# Patient Record
Sex: Male | Born: 1940 | Race: White | Hispanic: No | Marital: Married | Smoking: Never smoker
Health system: Southern US, Community
[De-identification: ages and names within clinical notes are randomized; demographics above are authoritative.]

## PROBLEM LIST (undated history)

## (undated) DIAGNOSIS — K219 Gastro-esophageal reflux disease without esophagitis: Secondary | ICD-10-CM

## (undated) DIAGNOSIS — C801 Malignant (primary) neoplasm, unspecified: Secondary | ICD-10-CM

---

## 2015-05-14 HISTORY — PX: OTHER SURGICAL HISTORY: SHX169

## 2015-09-01 ENCOUNTER — Other Ambulatory Visit: Payer: Self-pay | Admitting: Hematology & Oncology

## 2015-09-01 DIAGNOSIS — C189 Malignant neoplasm of colon, unspecified: Secondary | ICD-10-CM

## 2015-09-01 DIAGNOSIS — C787 Secondary malignant neoplasm of liver and intrahepatic bile duct: Principal | ICD-10-CM

## 2015-09-05 ENCOUNTER — Other Ambulatory Visit: Payer: Self-pay | Admitting: Hematology & Oncology

## 2015-09-05 ENCOUNTER — Inpatient Hospital Stay
Admission: RE | Admit: 2015-09-05 | Discharge: 2015-09-05 | Disposition: A | Discharge status: Home / Self Care | Payer: Self-pay | Source: Ambulatory Visit | Attending: Hematology & Oncology | Admitting: Hematology & Oncology

## 2015-09-05 DIAGNOSIS — C801 Malignant (primary) neoplasm, unspecified: Secondary | ICD-10-CM

## 2015-09-08 ENCOUNTER — Other Ambulatory Visit: Payer: Self-pay | Admitting: Radiology

## 2015-09-09 ENCOUNTER — Ambulatory Visit (HOSPITAL_COMMUNITY)
Admission: RE | Admit: 2015-09-09 | Discharge: 2015-09-09 | Disposition: A | Discharge status: Home / Self Care | Payer: Medicare PPO | Source: Ambulatory Visit | Attending: Hematology & Oncology | Admitting: Hematology & Oncology

## 2015-09-09 ENCOUNTER — Other Ambulatory Visit: Payer: Self-pay | Admitting: Hematology & Oncology

## 2015-09-09 ENCOUNTER — Inpatient Hospital Stay
Admission: RE | Admit: 2015-09-09 | Discharge: 2015-09-09 | Disposition: A | Discharge status: Home / Self Care | Payer: Self-pay | Source: Ambulatory Visit | Attending: Hematology & Oncology | Admitting: Hematology & Oncology

## 2015-09-09 ENCOUNTER — Encounter (HOSPITAL_COMMUNITY): Payer: Self-pay

## 2015-09-09 DIAGNOSIS — C801 Malignant (primary) neoplasm, unspecified: Secondary | ICD-10-CM

## 2015-09-09 DIAGNOSIS — C189 Malignant neoplasm of colon, unspecified: Secondary | ICD-10-CM

## 2015-09-09 DIAGNOSIS — C787 Secondary malignant neoplasm of liver and intrahepatic bile duct: Secondary | ICD-10-CM | POA: Insufficient documentation

## 2015-09-09 HISTORY — DX: Gastro-esophageal reflux disease without esophagitis: K21.9

## 2015-09-09 HISTORY — DX: Malignant (primary) neoplasm, unspecified: C80.1

## 2015-09-09 LAB — APTT: APTT: 30 s (ref 24–37)

## 2015-09-09 LAB — COMPREHENSIVE METABOLIC PANEL
ALBUMIN: 4.1 g/dL (ref 3.5–5.0)
ALK PHOS: 59 U/L (ref 38–126)
ALT: 12 U/L — AB (ref 17–63)
AST: 18 U/L (ref 15–41)
Anion gap: 8 (ref 5–15)
BILIRUBIN TOTAL: 0.3 mg/dL (ref 0.3–1.2)
BUN: 10 mg/dL (ref 6–20)
CO2: 25 mmol/L (ref 22–32)
CREATININE: 1.12 mg/dL (ref 0.61–1.24)
Calcium: 9.1 mg/dL (ref 8.9–10.3)
Chloride: 104 mmol/L (ref 101–111)
GFR calc Af Amer: >60 mL/min (ref >60)
GFR calc non Af Amer: >60 mL/min (ref >60)
GLUCOSE: 102 mg/dL — AB (ref 65–99)
POTASSIUM: 4.8 mmol/L (ref 3.5–5.1)
Sodium: 137 mmol/L (ref 135–145)
TOTAL PROTEIN: 7.1 g/dL (ref 6.5–8.1)

## 2015-09-09 LAB — CBC WITH DIFFERENTIAL/PLATELET
BASOS ABS: 0 10*3/uL (ref 0.0–0.1)
BASOS PCT: 0 %
Eosinophils Absolute: 0.7 10*3/uL (ref 0.0–0.7)
Eosinophils Relative: 13 %
HEMATOCRIT: 40.7 % (ref 39.0–52.0)
HEMOGLOBIN: 13.3 g/dL (ref 13.0–17.0)
LYMPHS PCT: 32 %
Lymphs Abs: 1.6 10*3/uL (ref 0.7–4.0)
MCH: 25.7 pg — ABNORMAL LOW (ref 26.0–34.0)
MCHC: 32.7 g/dL (ref 30.0–36.0)
MCV: 78.7 fL (ref 78.0–100.0)
Monocytes Absolute: 0.5 10*3/uL (ref 0.1–1.0)
Monocytes Relative: 9 %
NEUTROS ABS: 2.3 10*3/uL (ref 1.7–7.7)
NEUTROS PCT: 46 %
Platelets: 174 10*3/uL (ref 150–400)
RBC: 5.17 MIL/uL (ref 4.22–5.81)
RDW: 18.4 % — AB (ref 11.5–15.5)
WBC: 5 10*3/uL (ref 4.0–10.5)

## 2015-09-09 LAB — PROTIME-INR
INR: 1.1 (ref 0.00–1.49)
PROTHROMBIN TIME: 13.9 s (ref 11.6–15.2)

## 2015-09-09 MED ORDER — MIDAZOLAM HCL 2 MG/2ML IJ SOLN
INTRAMUSCULAR | Status: AC
  Filled 2015-09-09: qty 6

## 2015-09-09 MED ORDER — FENTANYL CITRATE (PF) 100 MCG/2ML IJ SOLN
INTRAMUSCULAR | Status: AC | PRN
  Administered 2015-09-09: 50 ug via INTRAVENOUS

## 2015-09-09 MED ORDER — FENTANYL CITRATE (PF) 100 MCG/2ML IJ SOLN
INTRAMUSCULAR | Status: AC
  Filled 2015-09-09: qty 4

## 2015-09-09 MED ORDER — MIDAZOLAM HCL 2 MG/2ML IJ SOLN
INTRAMUSCULAR | Status: AC | PRN
  Administered 2015-09-09: 0.5 mg via INTRAVENOUS
  Administered 2015-09-09: 1 mg via INTRAVENOUS

## 2015-09-09 MED ORDER — SODIUM CHLORIDE 0.9 % IV SOLN
INTRAVENOUS | Status: DC
  Administered 2015-09-09: 11:00:00 via INTRAVENOUS

## 2015-09-09 NOTE — Discharge Instructions (Signed)
Liver Biopsy °The liver is a large organ in the upper right-hand side of your abdomen. A liver biopsy is a procedure in which a tissue sample is taken from the liver and examined under a microscope. The procedure is done to confirm a suspected problem. °There are three types of liver biopsies: °· Percutaneous. In this type, an incision is made in your abdomen. The sample is removed through the incision with a needle. °· Laparoscopic. In this type, several incisions are made in the abdomen. A tiny camera is passed through one of the incisions to help guide the health care provider. The sample is removed through the other incision or incisions. °· Transjugular. In this type, an incision is made in the neck. A tube is passed through the incision to the liver. The sample is removed through the tube with a needle. °LET YOUR HEALTH CARE PROVIDER KNOW ABOUT: °· Any allergies you have. °· All medicines you are taking, including vitamins, herbs, eye drops, creams, and over-the-counter medicines. °· Previous problems you or members of your family have had with the use of anesthetics. °· Any blood disorders you have. °· Previous surgeries you have had. °· Medical conditions you have. °· Possibility of pregnancy, if this applies. °RISKS AND COMPLICATIONS °Generally, this is a safe procedure. However, problems can occur and include: °· Bleeding. °· Infection. °· Bruising. °· Collapsed lung. °· Leak of digestive juices (bile) from the liver or gallbladder. °· Problems with heart rhythm. °· Pain at the biopsy site or in the right shoulder. °· Low blood pressure (hypotension). °· Injury to nearby organs or tissues. °BEFORE THE PROCEDURE °· Your health care provider may do some blood or urine tests. These will help your health care provider learn how well your kidneys and liver are working and how well your blood clots. °· Ask your health care provider if you will be able to go home the day of the procedure. Arrange for someone to  take you home and stay with you for at least 24 hours. °· Do not eat or drink anything after midnight on the night before the procedure or as directed by your health care provider. °· Ask your health care provider about: °· Changing or stopping your regular medicines. This is especially important if you are taking diabetes medicines or blood thinners. °· Taking medicines such as aspirin and ibuprofen. These medicines can thin your blood. Do not take these medicines before your procedure if your health care provider asks you not to. °PROCEDURE °Regardless of the type of biopsy that will be done, you will have an IV line placed. Through this line, you will receive fluids and medicine to relax you. If you will be having a laparoscopic biopsy, you may also receive medicine through this line to make you sleep during the procedure (general anesthetic). °Percutaneous Liver Biopsy °· You will positioned on your back, with your right hand over your head. °· A health care provider will locate your liver by tapping and pressing on the right side of your abdomen or with the help of an ultrasound machine or CT scan. °· An area at the bottom of your last right rib will be numbed. °· An incision will be made in the numbed area. °· The biopsy needle will be inserted into the incision. °· Several samples of liver tissue will be taken with the biopsy needle. You will be asked to hold your breath as each sample is taken. °Laparoscopic Liver Biopsy °· You will be   positioned on your back. °· Several small incisions will be made in your abdomen. °· Your doctor will pass a tiny camera through one incision. The camera will allow the liver to be viewed on a TV monitor in the operating room. °· Tools will be passed through the other incision or incisions. These tools will be used to remove samples of liver tissue. °Transjugular Liver Biopsy °· You will be positioned on your back on an X-ray table, with your head turned to your left. °· An  area on your neck just over your jugular vein will be numbed. °· An incision will be made in the numbed area. °· A tiny tube will be inserted through the incision. It will be pushed through the jugular vein to a blood vessel in the liver called the hepatic vein. °· Dye will be inserted through the tube, and X-rays will be taken. The dye will make the blood vessels in the liver light up on the X-rays. °· The biopsy needle will be pushed through the tube until it reaches the liver. °· Samples of liver tissue will be taken with the biopsy needle. °· The needle and the tube will be removed. °After the samples are obtained, the incision or incisions will be closed. °AFTER THE PROCEDURE °· You will be taken to a recovery area. °· You may have to lie on your right side for 1-2 hours. This will prevent bleeding from the biopsy site. °· Your progress will be watched. Your blood pressure, pulse, and the biopsy site will be checked often. °· You may have some pain or feel sick. If this happens, tell your health care provider. °· As you begin to feel better, you will be offered ice and beverages. °· You may be allowed to go home when the medicines have worn off and you can walk, drink, eat, and use the bathroom. °  °This information is not intended to replace advice given to you by your health care provider. Make sure you discuss any questions you have with your health care provider. °  °Document Released: 05/22/2003 Document Revised: 03/22/2014 Document Reviewed: 04/27/2013 °Elsevier Interactive Patient Education ©2016 [employer] °Liver Biopsy, Care After °Refer to this sheet in the next few weeks. These instructions provide you with information on caring for yourself after your procedure. Your health care provider may also give you more specific instructions. Your treatment has been planned according to current medical practices, but problems sometimes occur. Call your health care provider if you have any problems or  questions after your procedure. °WHAT TO EXPECT AFTER THE PROCEDURE °After your procedure, it is typical to have the following: °· A small amount of discomfort in the area where the biopsy was done and in the right shoulder or shoulder blade. °· A small amount of bruising around the area where the biopsy was done and on the skin over the liver. °· Sleepiness and fatigue for the rest of the day. °HOME CARE INSTRUCTIONS  °· Rest at home for 1-2 days or as directed by your health care provider. °· Have a friend or family member stay with you for at least 24 hours. °· Because of the medicines used during the procedure, you should not do the following things in the first 24 hours: °¨ Drive. °¨ Use machinery. °¨ Be responsible for the care of other people. °¨ Sign legal documents. °¨ Take a bath or shower. °· There are many different ways to close and cover an incision, including stitches,   skin glue, and adhesive strips. Follow your health care provider's instructions on: °¨ Incision care. °¨ Bandage (dressing) changes and removal. °¨ Incision closure removal. °· Do not drink alcohol in the first week. °· Do not lift more than 5 pounds or play contact sports for 2 weeks after this test. °· Take medicines only as directed by your health care provider. Do not take medicine containing aspirin or non-steroidal anti-inflammatory medicines such as ibuprofen for 1 week after this test. °· It is your responsibility to get your test results. °SEEK MEDICAL CARE IF:  °· You have increased bleeding from an incision that results in more than a small spot of blood. °· You have redness, swelling, or increasing pain in any incisions. °· You notice a discharge or a bad smell coming from any of your incisions. °· You have a fever or chills. °SEEK IMMEDIATE MEDICAL CARE IF:  °· You develop swelling, bloating, or pain in your abdomen. °· You become dizzy or faint. °· You develop a rash. °· You are nauseous or vomit. °· You have difficulty  breathing, feel short of breath, or feel faint. °· You develop chest pain. °· You have problems with your speech or vision. °· You have trouble balancing or moving your arms or legs. °  °This information is not intended to replace advice given to you by your health care provider. Make sure you discuss any questions you have with your health care provider. °  °Document Released: 09/18/2004 Document Revised: 03/22/2014 Document Reviewed: 04/27/2013 °Elsevier Interactive Patient Education ©2016 [employer] °Moderate Conscious Sedation, Adult, Care After °Refer to this sheet in the next few weeks. These instructions provide you with information on caring for yourself after your procedure. Your health care provider may also give you more specific instructions. Your treatment has been planned according to current medical practices, but problems sometimes occur. Call your health care provider if you have any problems or questions after your procedure. °WHAT TO EXPECT AFTER THE PROCEDURE  °After your procedure: °· You may feel sleepy, clumsy, and have poor balance for several hours. °· Vomiting may occur if you eat too soon after the procedure. °HOME CARE INSTRUCTIONS °· Do not participate in any activities where you could become injured for at least 24 hours. Do not: °¨ Drive. °¨ Swim. °¨ Ride a bicycle. °¨ Operate heavy machinery. °¨ Cook. °¨ Use power tools. °¨ Climb ladders. °¨ Work from a high place. °· Do not make important decisions or sign legal documents until you are improved. °· If you vomit, drink water, juice, or soup when you can drink without vomiting. Make sure you have little or no nausea before eating solid foods. °· Only take over-the-counter or prescription medicines for pain, discomfort, or fever as directed by your health care provider. °· Make sure you and your family fully understand everything about the medicines given to you, including what side effects may occur. °· You should not drink alcohol,  take sleeping pills, or take medicines that cause drowsiness for at least 24 hours. °· If you smoke, do not smoke without supervision. °· If you are feeling better, you may resume normal activities 24 hours after you were sedated. °· Keep all appointments with your health care provider. °SEEK MEDICAL CARE IF: °· Your skin is pale or bluish in color. °· You continue to feel nauseous or vomit. °· Your pain is getting worse and is not helped by medicine. °· You have bleeding or swelling. °· You are   still sleepy or feeling clumsy after 24 hours. °SEEK IMMEDIATE MEDICAL CARE IF: °· You develop a rash. °· You have difficulty breathing. °· You develop any type of allergic problem. °· You have a fever. °MAKE SURE YOU: °· Understand these instructions. °· Will watch your condition. °· Will get help right away if you are not doing well or get worse. °  °This information is not intended to replace advice given to you by your health care provider. Make sure you discuss any questions you have with your health care provider. °  °Document Released: 12/20/2012 Document Revised: 03/22/2014 Document Reviewed: 12/20/2012 °Elsevier Interactive Patient Education ©2016 [employer] ° °

## 2015-09-09 NOTE — Procedures (Signed)
Interventional Radiology Procedure Note  Procedure: US guided core biopsy liver lesion.  Lesion very difficult to visualize, partially obscured by lung.  Region of lesion bx'd.   Complications: None immediate  Estimated Blood Loss: None  Recommendations: - Bedrest x 3 hrs - Path pending  Signed,  Criselda Peaches, MD

## 2015-09-09 NOTE — Progress Notes (Signed)
Pt is noted to have a colostomy bag in place.

## 2015-09-09 NOTE — Consult Note (Signed)
Chief Complaint: Patient was seen in consultation today for image guided liver lesion biopsy  Referring Physician(s): Ennever,Peter R  Supervising Physician: Jacqulynn Cadet  Patient Status: Outpatient  History of Present Illness: Joseph Bolton is a 75 y.o. male with history of recently diagnosed colon cancer in March 2017 and prior colectomy in [place]. Recent imaging studies have revealed possible liver metastasis. He presents today for image guided liver lesion biopsy for further evaluation.  Past Medical History  Diagnosis Date  . GERD (gastroesophageal reflux disease)   . Cancer [facility])     colon cancer-March 2017    Past Surgical History  Procedure Laterality Date  . Removal of large intestines  March 2017    Allergies: Review of patient's allergies indicates not on file.  Medications: Prior to Admission medications   Medication Sig Start Date End Date Taking? Authorizing Provider  famotidine (PEPCID) 20 MG tablet Take 20 mg by mouth 2 (two) times daily.   Yes Historical Provider, MD  ketoconazole (NIZORAL) 2 % cream Apply 1 application topically daily as needed for irritation. Applies to buttocks   Yes Historical Provider, MD     History reviewed. No pertinent family history.  Social History   Social History  . Marital Status: Married    Spouse Name: N/A  . Number of Children: N/A  . Years of Education: N/A   Social History Main Topics  . Smoking status: Never Smoker   . Smokeless tobacco: Current User    Types: Chew  . Alcohol Use: 4.2 oz/week    7 Shots of liquor per week     Comment: 1 shot of liquor a day-hasn't had since March 2017  . Drug Use: No  . Sexual Activity: Not Asked   Other Topics Concern  . None   Social History Narrative  . None     Review of Systems  Constitutional: Negative for fever and chills.  Respiratory: Negative for cough and shortness of breath.   Cardiovascular: Negative for chest pain.    Gastrointestinal: Negative for nausea, vomiting, abdominal pain and blood in stool.  Genitourinary: Negative for dysuria and hematuria.  Musculoskeletal: Negative for back pain.  Neurological: Negative for headaches.    Vital Signs: BP 156/64 mmHg  Pulse 66  Temp(Src) 97.9 F (36.6 C) (Oral)  Resp 18  Ht 5\' 6"  (1.676 m)  Wt 174 lb 3.2 oz (79.017 kg)  BMI 28.13 kg/m2  SpO2 100%  Physical Exam  Constitutional: He is oriented to person, place, and time. He appears well-developed and well-nourished.  Cardiovascular: Normal rate and regular rhythm.   Pulmonary/Chest: Effort normal and breath sounds normal.  Abdominal: Soft. Bowel sounds are normal.  Intact colostomy  Musculoskeletal: Normal range of motion. He exhibits no edema.  Neurological: He is alert and oriented to person, place, and time.    Mallampati Score:     Imaging: No results found.  Labs:  CBC:  Recent Labs  09/09/15 1105  WBC 5.0  HGB 13.3  HCT 40.7  PLT 174    COAGS:  Recent Labs  09/09/15 1105  INR 1.10  APTT 30    BMP:  Recent Labs  09/09/15 1105  NA 137  K 4.8  CL 104  CO2 25  GLUCOSE 102*  BUN 10  CALCIUM 9.1  CREATININE 1.12  GFRNONAA >60  GFRAA >60    LIVER FUNCTION TESTS:  Recent Labs  09/09/15 1105  BILITOT 0.3  AST 18  ALT 12*  ALKPHOS 59  PROT 7.1  ALBUMIN 4.1    TUMOR MARKERS: No results for input(s): AFPTM, CEA, CA199, CHROMGRNA in the last 8760 hours.  Assessment and Plan: Patient with recently diagnosed colon cancer and possible liver metastasis (previous workup performed in [place]). Plan today is for image guided liver lesion biopsy for further evaluation.Risks and benefits discussed with the patient including, but not limited to bleeding, infection, damage to adjacent structures or low yield requiring additional tests.All of the patient's questions were answered, patient is agreeable to proceed.Consent signed and in chart.     Thank you for  this interesting consult.  I greatly enjoyed meeting CANON WYKLE and look forward to participating in their care.  A copy of this report was sent to the requesting provider on this date.  Electronically Signed: D. Rowe Robert 09/09/2015, 1:14 PM   I spent a total of 20 minutes    in face to face in clinical consultation, greater than 50% of which was counseling/coordinating care for image guided liver lesion biopsy

## 2015-09-12 ENCOUNTER — Encounter: Payer: Self-pay | Admitting: Nurse Practitioner

## 2015-09-12 NOTE — Progress Notes (Signed)
Pathology information sent to Joseph Bolton, [place] at [phone] as requested. Confirmation received.

## 2015-09-23 ENCOUNTER — Encounter (HOSPITAL_COMMUNITY): Payer: Self-pay

## 2017-11-03 IMAGING — CT CT OUTSIDE FILMS BODY
3 series · 17 of 46 positions shown, 20 images · non-contrast
Comparison: none

[Series 3: non con 5.0 b30f · axial · non-contrast · 0.80mm/px · z∈[-214,+76]mm · 13 of 66 slices shown, 16 images]
[im 5/66  soft-tissue]
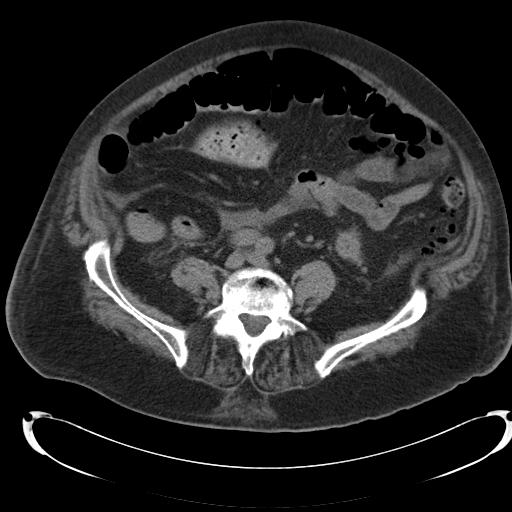
[im 5/66  bone]
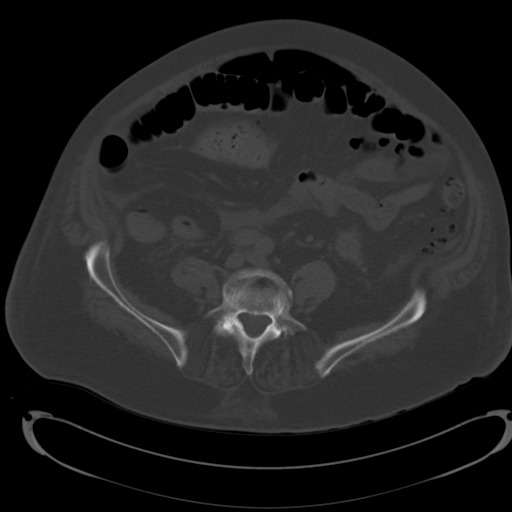
[im 11/66  soft-tissue]
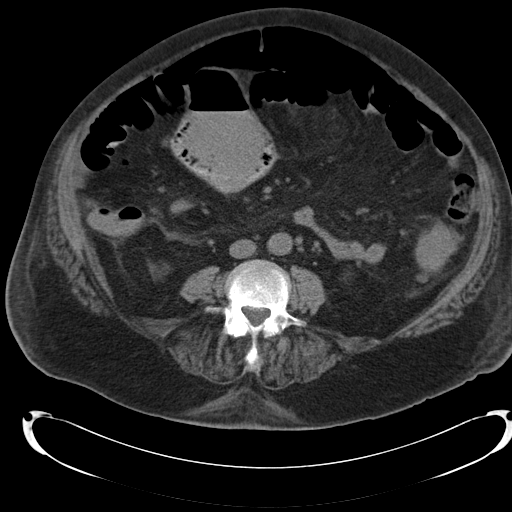
[im 17/66  soft-tissue]
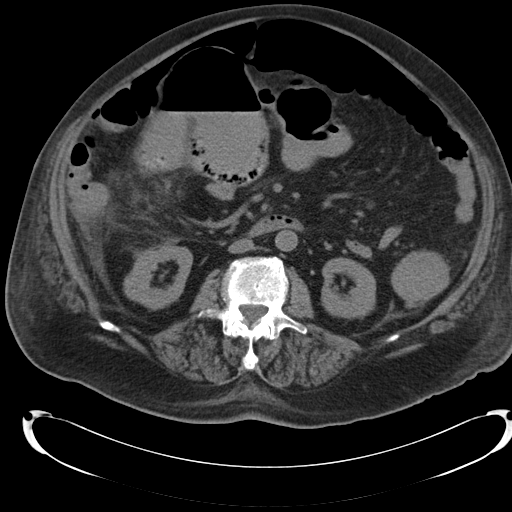
[im 24/66  soft-tissue]
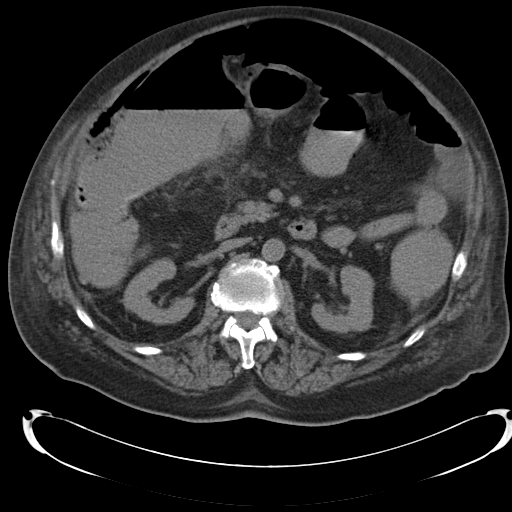
[im 30/66  soft-tissue]
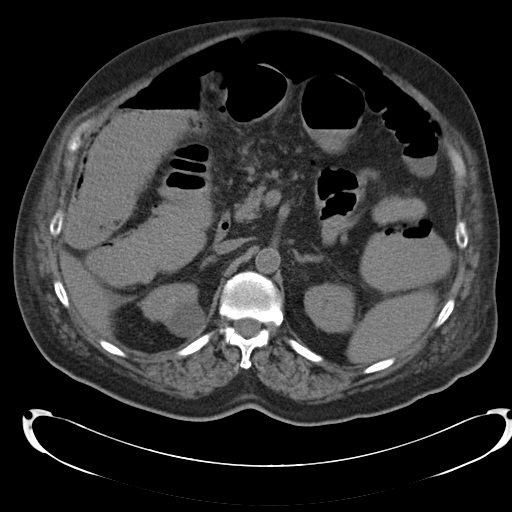
[im 36/66  soft-tissue]
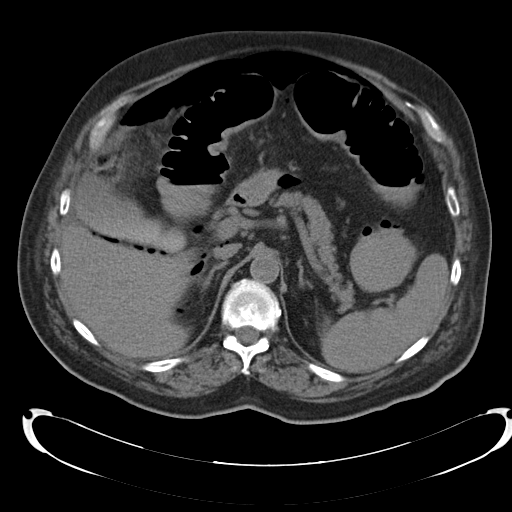
[im 42/66  soft-tissue]
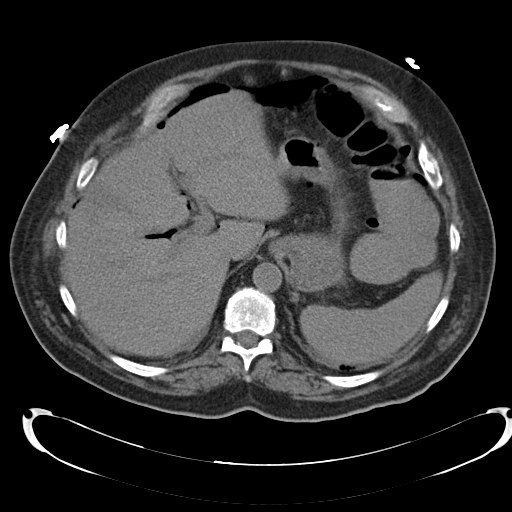
[im 49/66  soft-tissue]
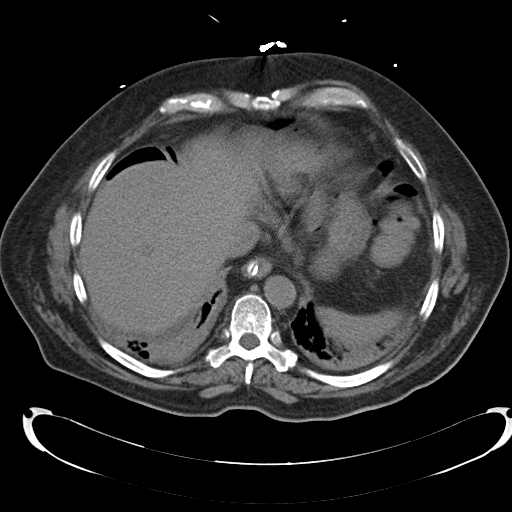
[im 55/66  soft-tissue]
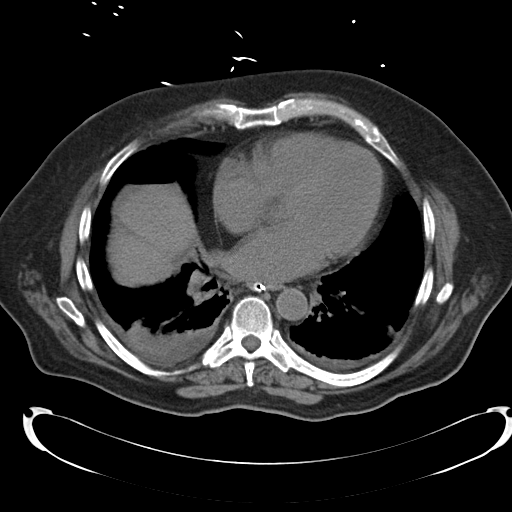
[im 55/66  bone]
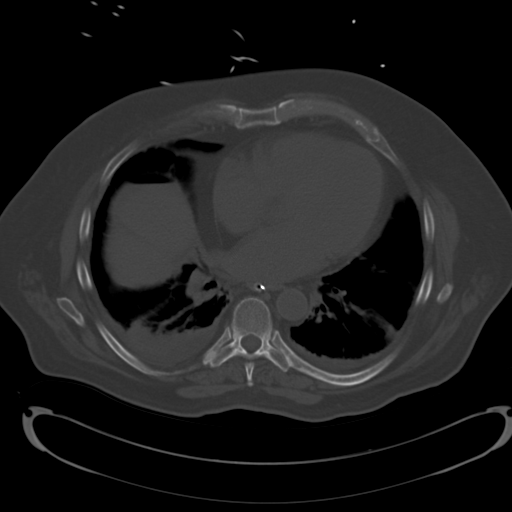
[im 57/66  lung]
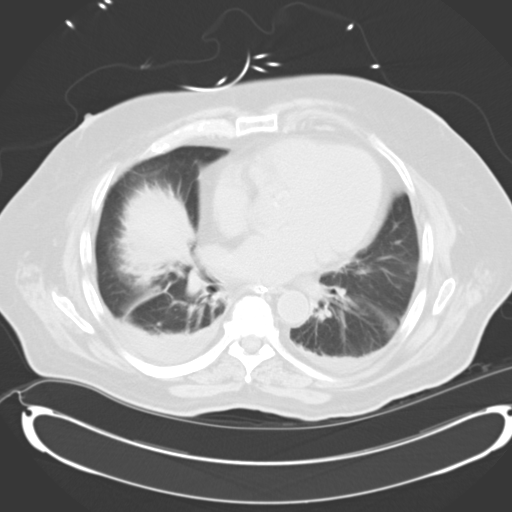
[im 59/66  lung]
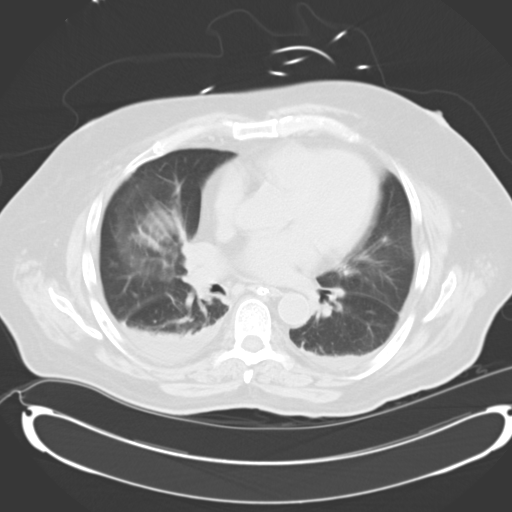
[im 61/66  soft-tissue]
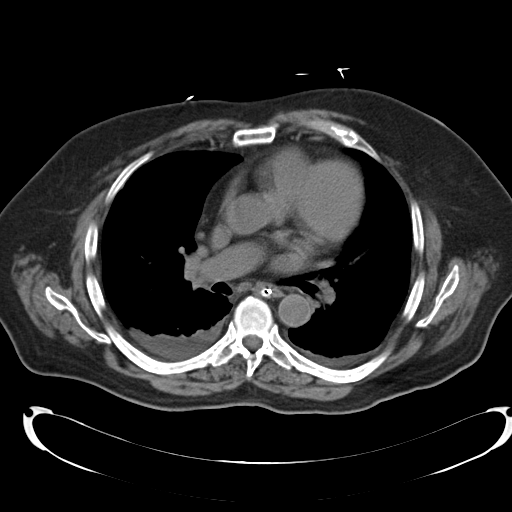
[im 61/66  lung]
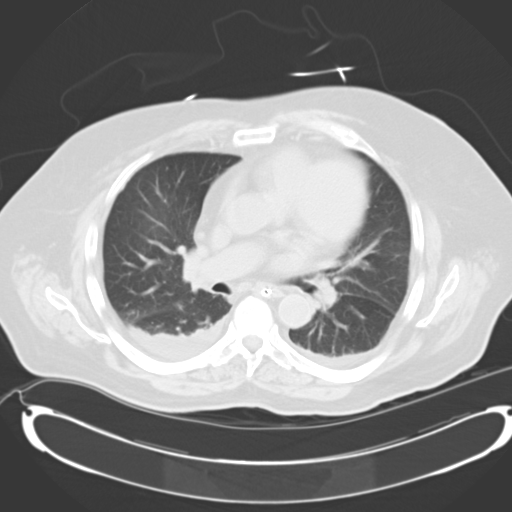
[im 63/66  lung]
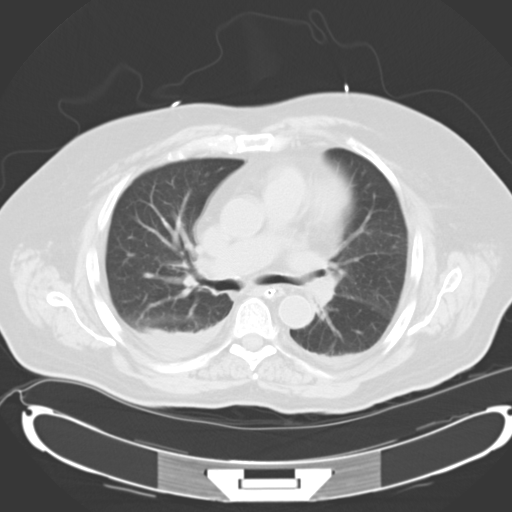

[Series 4: non con 5.0 spo sag sag · sagittal · non-contrast · 0.74mm/px · 1 of 72 slices shown]
[im 24/72  soft-tissue]
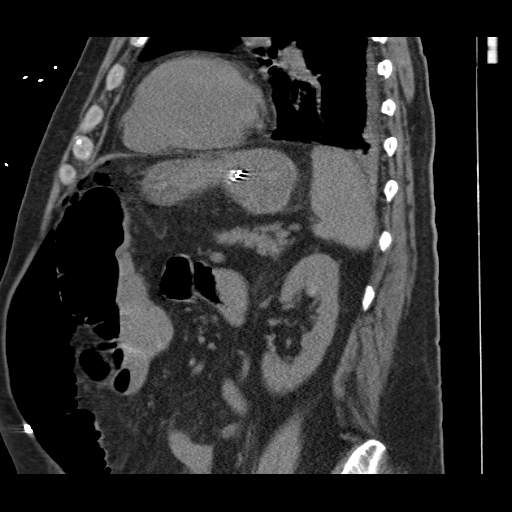

[Series 5: non con 5.0 spo cor cor · coronal · non-contrast · 0.95mm/px · 3 of 60 slices shown]
[im 20/60  soft-tissue]
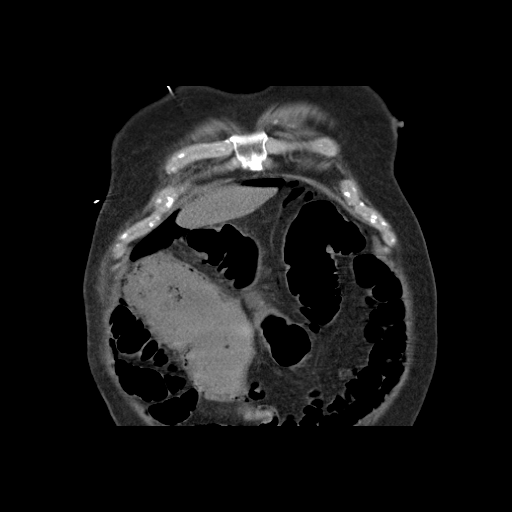
[im 27/60  soft-tissue]
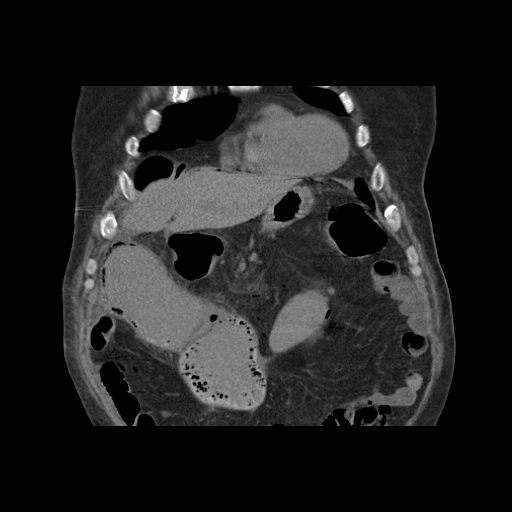
[im 33/60  soft-tissue]
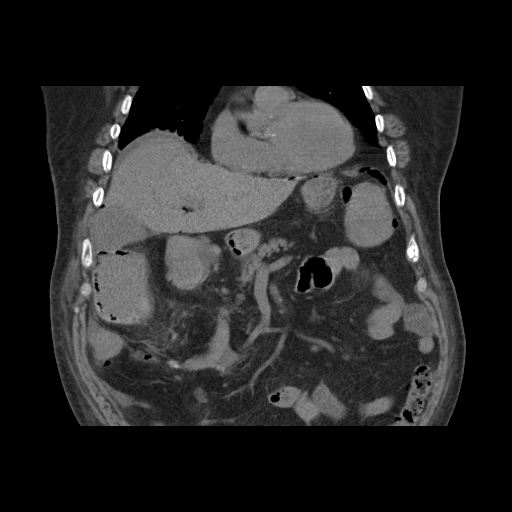

[17 of 46 positions shown; findings below may reference images not displayed]

Canned report from images found in remote index.

Refer to host system for actual result text.

## 2018-02-04 IMAGING — US US BIOPSY
1 series · 13 of 16 positions shown · non-contrast
Comparison: none

INDICATION: 75-year-old male with a history of colon cancer and small
hypermetabolic lesions within the liver run recent PET-CT concerning
for metastatic disease.

[Series 1: us biopsy · 0.30mm/px · 13 of 16 slices shown]
[im 1/16]
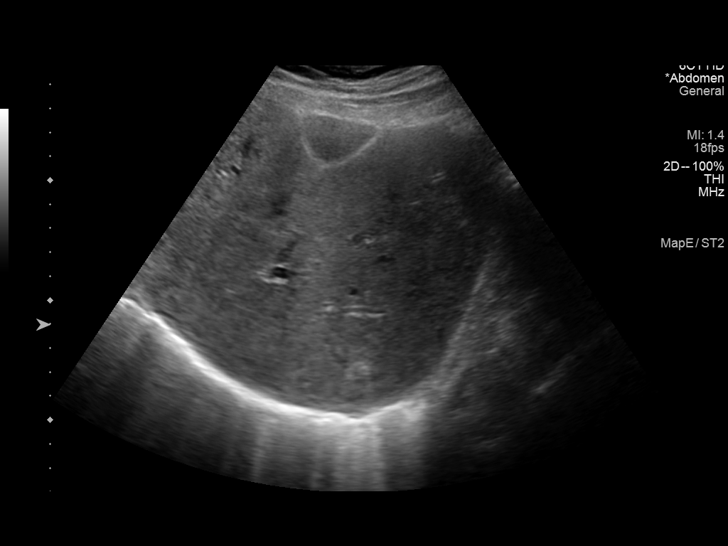
[im 2/16]
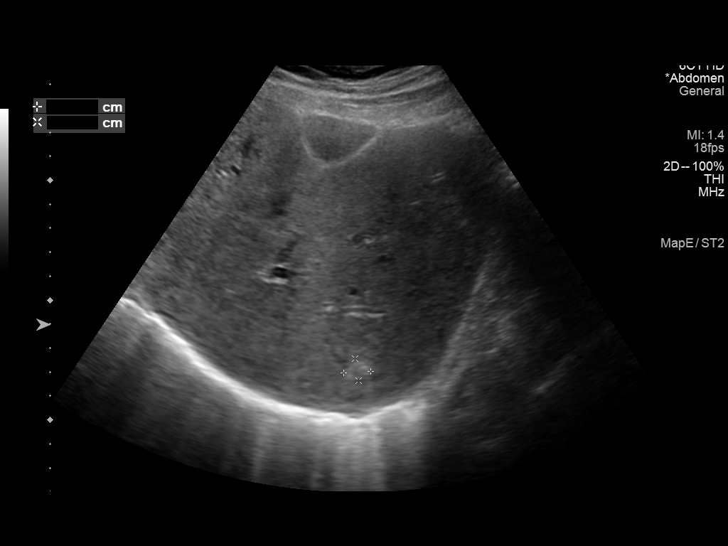
[im 4/16]
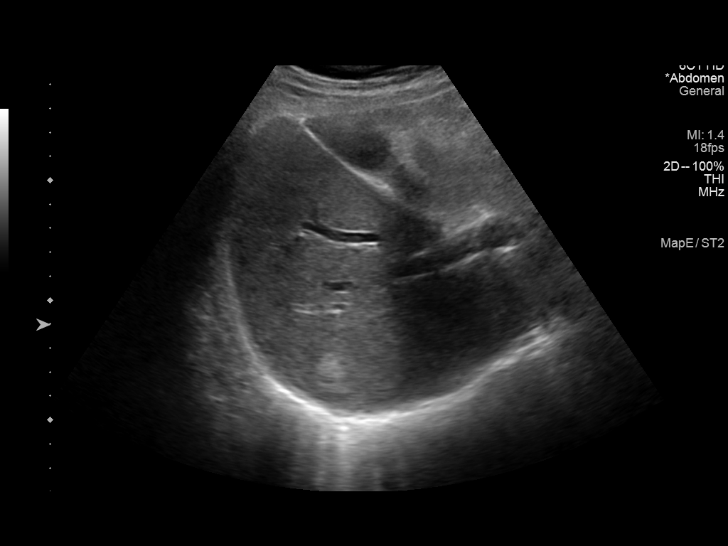
[im 5/16]
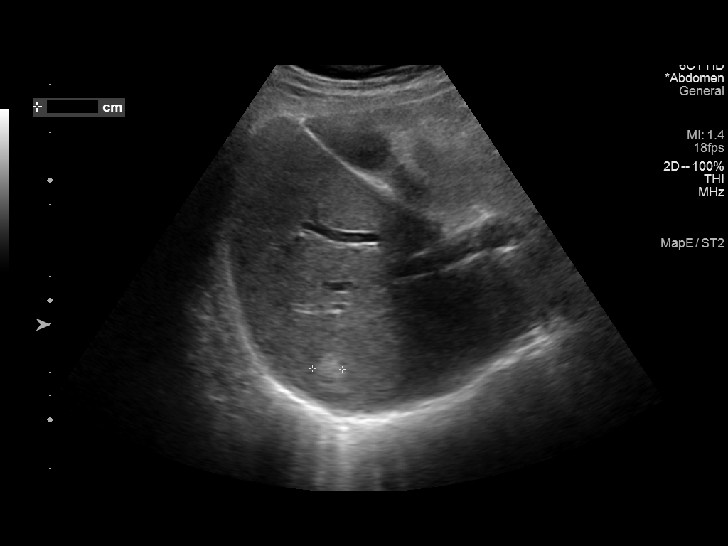
[im 6/16]
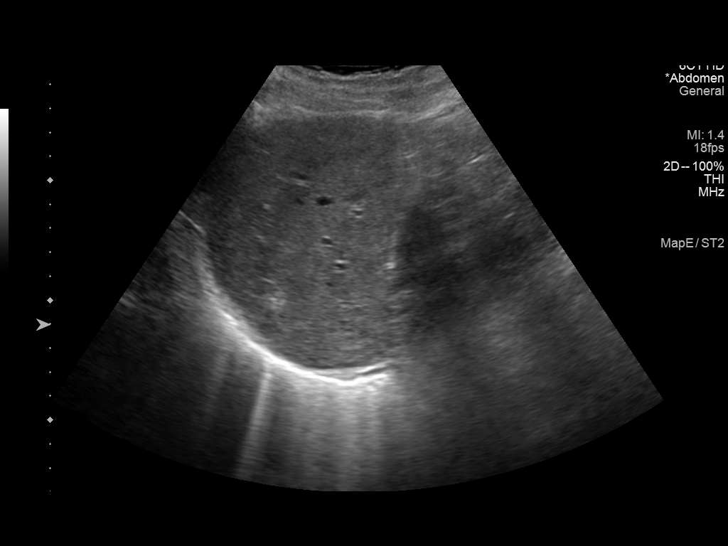
[im 7/16]
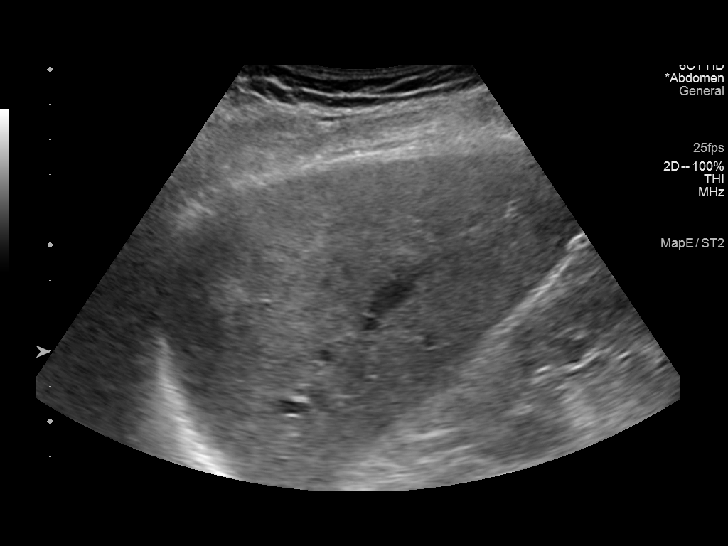
[im 9/16]
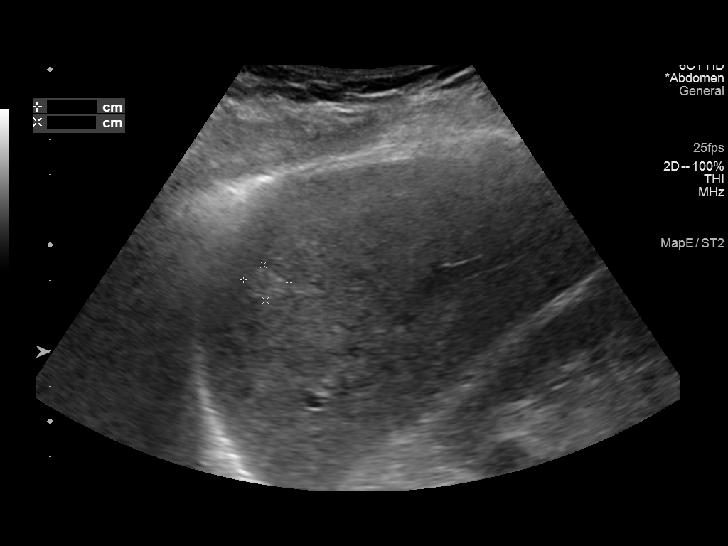
[im 10/16]
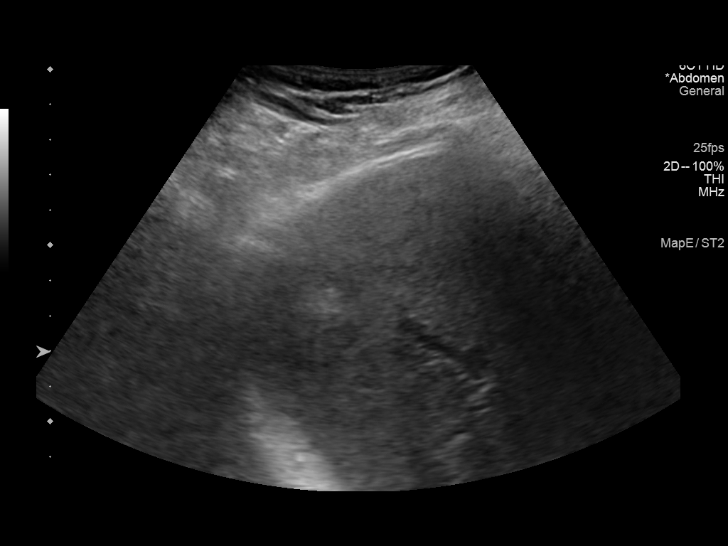
[im 11/16]
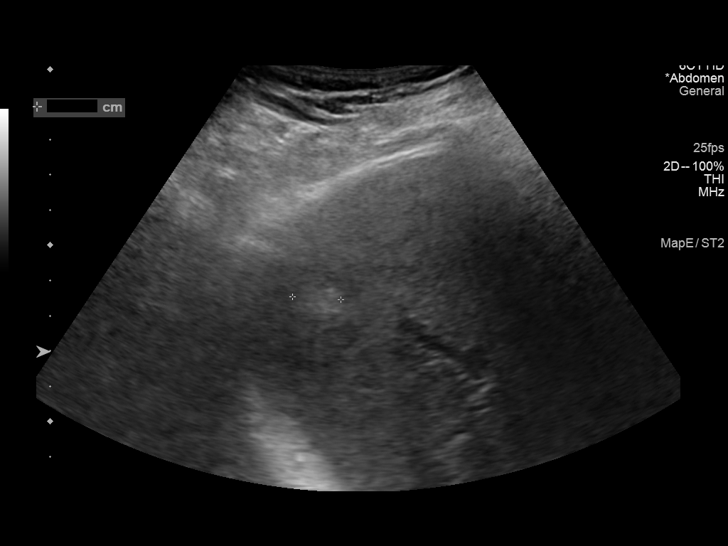
[im 12/16]
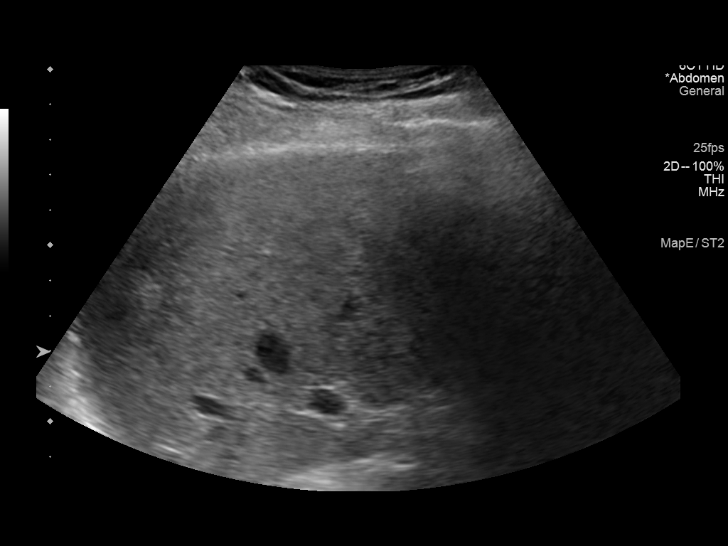
[im 13/16]
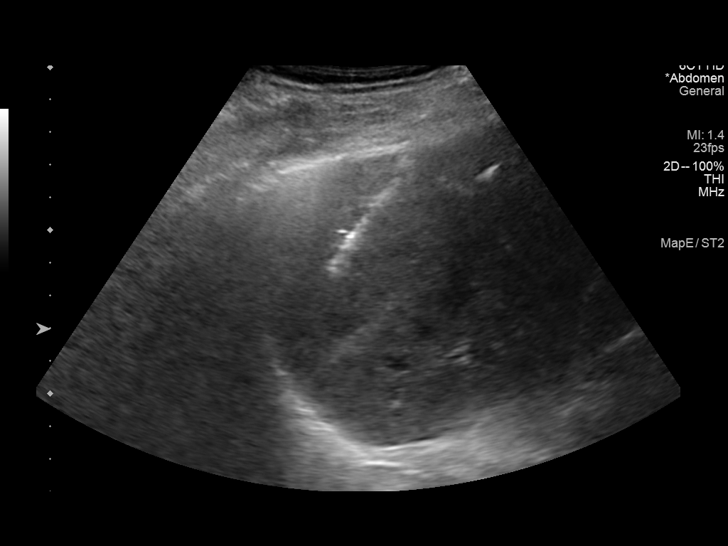
[im 15/16]
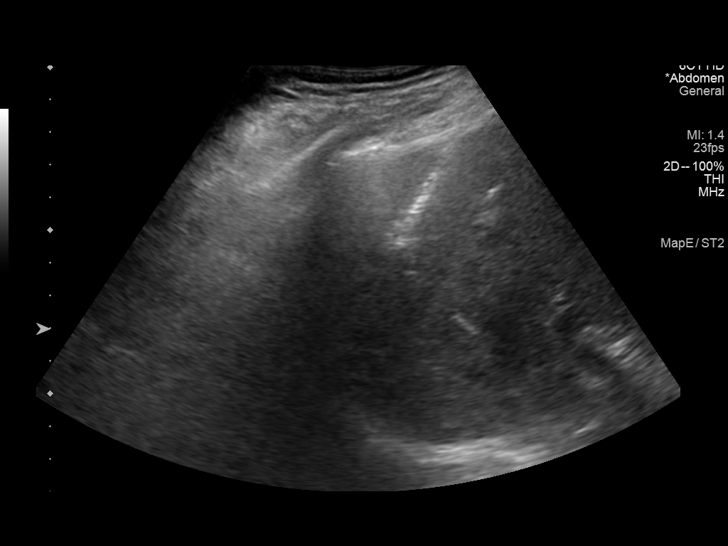
[im 16/16]
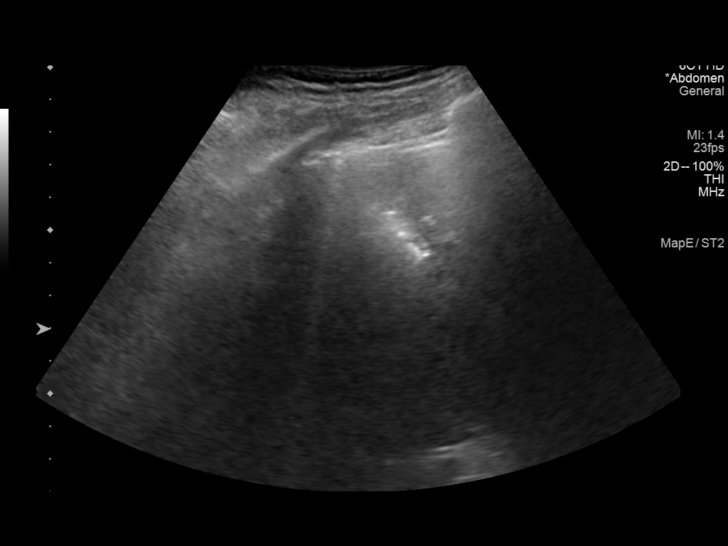

[13 of 16 positions shown; findings below may reference images not displayed]

EXAM:
Ultrasound-guided core biopsy of the liver

MEDICATIONS:
None.

ANESTHESIA/SEDATION:
Fentanyl 50 mcg IV; Versed 2 mg IV

Moderate Sedation Time:  22 minutes

The patient was continuously monitored during the procedure by the
interventional radiology nurse under my direct supervision.

FLUOROSCOPY TIME:  None.

COMPLICATIONS:
None immediate.

Estimated blood loss: None

PROCEDURE:
Informed consent was obtained from the patient following explanation
of the procedure, risks, benefits and alternatives. The patient
understands, agrees and consents for the procedure. All questions
were addressed. A time out was performed.

The right upper quadrant was interrogated with ultrasound. A subtle
slightly echogenic lesion is identified in the periphery of the
right hepatic lobe. The lesion measures approximately 1.3 x 1.0 cm.
A suitable skin entry site was selected and marked. The region was
then sterilely prepped and draped in standard fashion using
chlorhexidine skin prep. Local anesthesia was attained by
infiltration with 1% lidocaine. A small dermatotomy was made.

Under real-time sonographic guidance, a 17 gauge trocar needle was
advanced into the liver. Unfortunately, following sedation the
patient is liver elevated in the lesion was largely obscured by the
surrounding aerated lung tissue. Despite significant effort and
attempts at a having the patient very there breathing and hold other
breath the lesion was very difficult to see. Multiple 18 gauge core
biopsies were then coaxially obtained of the region of the nodule.
Needle placement was not completely confirmed on all biopsy passes.
Biopsy specimens were placed in formalin and delivered to pathology
for further analysis.

As the introducer needle was removed, the biopsy tract was embolized
with a Gel-Foam slurry. Post biopsy ultrasound imaging demonstrates
no active bleeding or perihepatic hematoma. The patient tolerated
the procedure well.
IMPRESSION: 1. Technically challenging biopsy procedure. Only 1 small (1.3 cm)
slightly echogenic nodule can be visualized and the lesion is
peripheral and high in the liver partially surrounded by lung
tissue.
2. Ultrasound-guided biopsy was attempted. If biopsy specimen is
negative or nondiagnostic, under sampling may be an issue. If repeat
biopsy is required, would recommend pursuing CT or other imaging
guidance.

## 2019-02-13 DEATH — deceased
